# Patient Record
Sex: Female | Born: 1935 | Race: White | Hispanic: No | Marital: Single
Health system: Southern US, Community
[De-identification: ages and names within clinical notes are randomized; demographics above are authoritative.]

---

## 2019-09-01 ENCOUNTER — Observation Stay (HOSPITAL_COMMUNITY)
Admission: EM | Admit: 2019-09-01 | Discharge: 2019-09-02 | Disposition: A | Discharge status: Home / Self Care | Payer: Medicare Other | Attending: Surgery | Admitting: Surgery

## 2019-09-01 ENCOUNTER — Other Ambulatory Visit: Payer: Self-pay

## 2019-09-01 ENCOUNTER — Emergency Department: Payer: Self-pay

## 2019-09-01 ENCOUNTER — Emergency Department (HOSPITAL_COMMUNITY): Payer: Medicare Other

## 2019-09-01 DIAGNOSIS — R52 Pain, unspecified: Secondary | ICD-10-CM

## 2019-09-01 DIAGNOSIS — R2689 Other abnormalities of gait and mobility: Secondary | ICD-10-CM | POA: Diagnosis not present

## 2019-09-01 DIAGNOSIS — Z20822 Contact with and (suspected) exposure to covid-19: Secondary | ICD-10-CM | POA: Diagnosis not present

## 2019-09-01 DIAGNOSIS — W109XXA Fall (on) (from) unspecified stairs and steps, initial encounter: Secondary | ICD-10-CM | POA: Diagnosis not present

## 2019-09-01 DIAGNOSIS — R0902 Hypoxemia: Secondary | ICD-10-CM | POA: Insufficient documentation

## 2019-09-01 DIAGNOSIS — S2242XA Multiple fractures of ribs, left side, initial encounter for closed fracture: Secondary | ICD-10-CM | POA: Diagnosis present

## 2019-09-01 DIAGNOSIS — Z9181 History of falling: Secondary | ICD-10-CM | POA: Diagnosis not present

## 2019-09-01 DIAGNOSIS — S0101XA Laceration without foreign body of scalp, initial encounter: Secondary | ICD-10-CM | POA: Insufficient documentation

## 2019-09-01 DIAGNOSIS — I10 Essential (primary) hypertension: Secondary | ICD-10-CM | POA: Insufficient documentation

## 2019-09-01 DIAGNOSIS — E785 Hyperlipidemia, unspecified: Secondary | ICD-10-CM | POA: Diagnosis not present

## 2019-09-01 DIAGNOSIS — S2249XA Multiple fractures of ribs, unspecified side, initial encounter for closed fracture: Secondary | ICD-10-CM

## 2019-09-01 LAB — BASIC METABOLIC PANEL
Anion gap: 10 (ref 5–15)
BUN: 11 mg/dL (ref 8–23)
CO2: 24 mmol/L (ref 22–32)
Calcium: 9.2 mg/dL (ref 8.9–10.3)
Chloride: 107 mmol/L (ref 98–111)
Creatinine, Ser: 0.66 mg/dL (ref 0.44–1.00)
GFR calc Af Amer: >60 mL/min (ref >60)
GFR calc non Af Amer: >60 mL/min (ref >60)
Glucose, Bld: 168 mg/dL — ABNORMAL HIGH (ref 70–99)
Potassium: 3.9 mmol/L (ref 3.5–5.1)
Sodium: 141 mmol/L (ref 135–145)

## 2019-09-01 LAB — CBC
HCT: 34.5 % — ABNORMAL LOW (ref 36.0–46.0)
Hemoglobin: 11.2 g/dL — ABNORMAL LOW (ref 12.0–15.0)
MCH: 31.9 pg (ref 26.0–34.0)
MCHC: 32.5 g/dL (ref 30.0–36.0)
MCV: 98.3 fL (ref 80.0–100.0)
Platelets: 166 10*3/uL (ref 150–400)
RBC: 3.51 MIL/uL — ABNORMAL LOW (ref 3.87–5.11)
RDW: 13.3 % (ref 11.5–15.5)
WBC: 9.8 10*3/uL (ref 4.0–10.5)
nRBC: 0 % (ref 0.0–0.2)

## 2019-09-01 LAB — SARS CORONAVIRUS 2 BY RT PCR (HOSPITAL ORDER, PERFORMED IN ~~LOC~~ HOSPITAL LAB): SARS Coronavirus 2: NEGATIVE

## 2019-09-01 LAB — PROTIME-INR
INR: 1.1 (ref 0.8–1.2)
Prothrombin Time: 14.2 seconds (ref 11.4–15.2)

## 2019-09-01 MED ORDER — SODIUM CHLORIDE 0.9% FLUSH
3.0000 mL | Freq: Two times a day (BID) | INTRAVENOUS | Status: DC
  Administered 2019-09-01 – 2019-09-02 (×3): 3 mL via INTRAVENOUS

## 2019-09-01 MED ORDER — ACETAMINOPHEN 500 MG PO TABS
1000.0000 mg | ORAL_TABLET | Freq: Four times a day (QID) | ORAL | Status: DC
  Administered 2019-09-01 – 2019-09-02 (×5): 1000 mg via ORAL
  Filled 2019-09-01 (×6): qty 2

## 2019-09-01 MED ORDER — LIDOCAINE 5 % EX PTCH
1.0000 | MEDICATED_PATCH | CUTANEOUS | Status: DC
  Administered 2019-09-01 – 2019-09-02 (×2): 1 via TRANSDERMAL
  Filled 2019-09-01 (×2): qty 1

## 2019-09-01 MED ORDER — ENOXAPARIN SODIUM 30 MG/0.3ML ~~LOC~~ SOLN
30.0000 mg | Freq: Two times a day (BID) | SUBCUTANEOUS | Status: DC
  Administered 2019-09-01 – 2019-09-02 (×3): 30 mg via SUBCUTANEOUS
  Filled 2019-09-01 (×3): qty 0.3

## 2019-09-01 MED ORDER — WHITE PETROLATUM EX OINT
TOPICAL_OINTMENT | CUTANEOUS | Status: AC
  Filled 2019-09-01: qty 28.35

## 2019-09-01 MED ORDER — KETOROLAC TROMETHAMINE 15 MG/ML IJ SOLN
15.0000 mg | Freq: Four times a day (QID) | INTRAMUSCULAR | Status: DC | PRN
  Administered 2019-09-01 (×2): 15 mg via INTRAVENOUS
  Filled 2019-09-01 (×2): qty 1

## 2019-09-01 MED ORDER — ONDANSETRON 4 MG PO TBDP: 4.0000 mg | ORAL_TABLET | Freq: Four times a day (QID) | ORAL | Status: DC | PRN

## 2019-09-01 MED ORDER — SODIUM CHLORIDE 0.9 % IV SOLN: 250.0000 mL | INTRAVENOUS | Status: DC | PRN

## 2019-09-01 MED ORDER — ONDANSETRON HCL 4 MG/2ML IJ SOLN
4.0000 mg | Freq: Once | INTRAMUSCULAR | Status: AC
  Administered 2019-09-01: 4 mg via INTRAVENOUS
  Filled 2019-09-01: qty 2

## 2019-09-01 MED ORDER — ONDANSETRON HCL 4 MG/2ML IJ SOLN
4.0000 mg | Freq: Four times a day (QID) | INTRAMUSCULAR | Status: DC | PRN
  Administered 2019-09-01: 4 mg via INTRAVENOUS
  Filled 2019-09-01: qty 2

## 2019-09-01 MED ORDER — METHOCARBAMOL 500 MG PO TABS
1000.0000 mg | ORAL_TABLET | Freq: Three times a day (TID) | ORAL | Status: DC
  Administered 2019-09-01 – 2019-09-02 (×5): 1000 mg via ORAL
  Filled 2019-09-01 (×5): qty 2

## 2019-09-01 MED ORDER — DOCUSATE SODIUM 100 MG PO CAPS
100.0000 mg | ORAL_CAPSULE | Freq: Two times a day (BID) | ORAL | Status: DC
  Administered 2019-09-01 – 2019-09-02 (×3): 100 mg via ORAL
  Filled 2019-09-01 (×3): qty 1

## 2019-09-01 MED ORDER — SODIUM CHLORIDE 0.9% FLUSH
3.0000 mL | INTRAVENOUS | Status: DC | PRN
  Administered 2019-09-02: 3 mL via INTRAVENOUS

## 2019-09-01 NOTE — Progress Notes (Signed)
Occupational Therapy Evaluation Patient Details Name: Janet Dickerson MRN: [id number] DOB: 1927-12-27 Today's Date: 09/01/2019    History of Present Illness Pt is an 86 y/o female admitted after fall that resulted in L sided rib fxs. No pertinent PMH on file.    Clinical Impression   Prior to hospitalization, pt was Independent with all ADLs/IADLs including driving. Pt does not own AE/DME, except a raised commode. Pt admitted for above and limited by increased pain in L ribs, decreased activity tolerance, decreased ROM, and decreased balance. Today, pt received semi-reclined asleep. Pt c/o 4/10 pain in L ribs, using pillow for bracing during functional bed transitions. Pt tolerated sitting EOB for ~20 min to dress, sponge bathe, and groom. 3-4x pt c/o nausea, bucket provided for safety measures. Pt requires supervision for bed mobility, setup for grooming, min assist for UB dressing and bathing, supervision for doffing pants, max assist for managing socks, and min guard for functional transfers. Pt would benefit from continued skilled acute OT services to address ADLs/IADLs and functional mobility. Recommending HHOT and 24/7 supervision for safety secondary to increased pain. Will continue to follow pt acutely as able.     Follow Up Recommendations  Home health OT;Supervision/Assistance - 24 hour    Equipment Recommendations  3 in 1 bedside commode;Tub/shower bench    Recommendations for Other Services       Precautions / Restrictions Precautions Precautions: Fall Restrictions Weight Bearing Restrictions: No      Mobility Bed Mobility Overal bed mobility: Needs Assistance Bed Mobility: Sidelying to Sit;Sit to Supine   Sidelying to sit: Supervision;HOB elevated     Sit to sidelying: Supervision;HOB elevated General bed mobility comments: Supervision for safety. Increased time required.   Transfers Overall transfer level: Needs assistance Equipment used: None Transfers: Sit  to/from Stand Sit to Stand: Min guard         General transfer comment: Min guard for safety. Increased time required.     Balance Overall balance assessment: Needs assistance Sitting-balance support: No upper extremity supported;Feet supported Sitting balance-Leahy Scale: Good     Standing balance support: No upper extremity supported;During functional activity Standing balance-Leahy Scale: Fair Standing balance comment: able to maintain dynamic standind for LB dressing with min guard for balance                           ADL either performed or assessed with clinical judgement   ADL Overall ADL's : Needs assistance/impaired     Grooming: Wash/dry hands;Wash/dry face;Set up;Sitting   Upper Body Bathing: Minimal assistance;Sitting       Upper Body Dressing : Minimal assistance;Sitting   Lower Body Dressing: Min guard;Sitting/lateral leans;Sit to/from stand               Functional mobility during ADLs: Min guard General ADL Comments: pt tolerated sitting EOB for grooming, dressing, and sponge bathing; pt in too much pain to ambulate to/from bathroom     Vision Baseline Vision/History: Glaucoma;Cataracts;Wears glasses (pt reports having glaucoma and cataracts ) Wears Glasses: Reading only;Distance only Patient Visual Report: No change from baseline       Perception     Praxis      Pertinent Vitals/Pain Pain Assessment: Faces Pain Score: 3  Faces Pain Scale: Hurts whole lot Pain Location: L ribs Pain Descriptors / Indicators: Aching;Guarding;Grimacing Pain Intervention(s): Limited activity within patient's tolerance;Monitored during session;Repositioned     Hand Dominance     Extremity/Trunk Assessment Upper Extremity  Assessment Upper Extremity Assessment: Overall WFL for tasks assessed;Generalized weakness   Lower Extremity Assessment Lower Extremity Assessment: Defer to PT evaluation   Cervical / Trunk Assessment Cervical / Trunk  Assessment: Other exceptions Cervical / Trunk Exceptions: L side rib fxs   Communication Communication Communication: No difficulties   Cognition Arousal/Alertness: Awake/alert Behavior During Therapy: WFL for tasks assessed/performed Overall Cognitive Status: Within Functional Limits for tasks assessed                                     General Comments  used pillow for bracing L ribs; provided bag secondary to nausea    Exercises     Shoulder Instructions      Home Living Family/patient expects to be discharged to:: Private residence Living Arrangements: Alone Available Help at Discharge: Family Type of Home: House Home Access: Stairs to enter Technical brewer of Steps: 2   Home Layout: One level     Bathroom Shower/Tub: Teacher, early years/pre: Handicapped height     Home Equipment: None          Prior Functioning/Environment Level of Independence: Independent                 OT Problem List: Decreased activity tolerance;Impaired balance (sitting and/or standing);Pain      OT Treatment/Interventions: Self-care/ADL training;Therapeutic exercise;Energy conservation;DME and/or AE instruction;Therapeutic activities;Patient/family education    OT Goals(Current goals can be found in the care plan section) Acute Rehab OT Goals Patient Stated Goal: to go home OT Goal Formulation: With patient  OT Frequency: Min 3X/week   Barriers to D/C:            Co-evaluation              AM-PAC OT "6 Clicks" Daily Activity     Outcome Measure Help from another person eating meals?: None (however pt refusing to eat today secondary to nausea) Help from another person taking care of personal grooming?: A Little Help from another person toileting, which includes using toliet, bedpan, or urinal?: A Little Help from another person bathing (including washing, rinsing, drying)?: A Little Help from another person to put on and taking  off regular upper body clothing?: A Little Help from another person to put on and taking off regular lower body clothing?: A Little 6 Click Score: 19   End of Session Equipment Utilized During Treatment: Gait belt Nurse Communication: Mobility status  Activity Tolerance: Patient tolerated treatment well;Patient limited by pain Patient left: in bed;with call bell/phone within reach;with chair alarm set;with nursing/sitter in room  OT Visit Diagnosis: Unsteadiness on feet (R26.81);Muscle weakness (generalized) (M62.81);Pain Pain - Right/Left: Left Pain - part of body:  (ribs)                Time: 0865-7846 OT Time Calculation (min): 36 min Charges:  OT General Charges $OT Visit: 1 Visit OT Evaluation $OT Eval Low Complexity: 1 Low OT Treatments $Self Care/Home Management : 8-22 mins  Michel Bickers, OTR/L [facility] [phone]  Francesca Jewett 09/01/2019, 7:47 PM

## 2019-09-01 NOTE — Evaluation (Signed)
Physical Therapy Evaluation Patient Details Name: Janet Dickerson MRN: [id number] DOB: Mar 21, 1927 Today's Date: 09/01/2019   History of Present Illness  Pt is an 86 y/o female admitted after fall that resulted in L sided rib fxs. No pertinent PMH on file.   Clinical Impression  Pt admitted secondary to problem above with deficits below. Required min guard for mobility tasks. Improved safety noted with use of RW. Educated about using RW at home. Pt reports daughter and son in law can assist as needed. Feel pt would benefit from HHPT at d/c. Will continue to follow acutely.     Follow Up Recommendations Home health PT;Supervision for mobility/OOB    Equipment Recommendations  Rolling walker with 5" wheels    Recommendations for Other Services       Precautions / Restrictions Precautions Precautions: Fall Restrictions Weight Bearing Restrictions: No      Mobility  Bed Mobility Overal bed mobility: Needs Assistance Bed Mobility: Sidelying to Sit;Sit to Sidelying   Sidelying to sit: Supervision     Sit to sidelying: Supervision General bed mobility comments: Supervision for safety. Increased time required.   Transfers Overall transfer level: Needs assistance Equipment used: None Transfers: Sit to/from Stand Sit to Stand: Min guard         General transfer comment: Min guard for safety. Increased time required.   Ambulation/Gait Ambulation/Gait assistance: Min guard Gait Distance (Feet): 40 Feet Assistive device: 1 person hand held assist;Rolling walker (2 wheeled) Gait Pattern/deviations: Step-to pattern;Decreased stride length Gait velocity: Decreased   General Gait Details: Pt reaching out to hold for objects without AD. Much improved safety with use of RW. Educated about using a RW at home to increase independence and safety.   Stairs            Wheelchair Mobility    Modified Rankin (Stroke Patients Only)       Balance Overall balance assessment:  Needs assistance Sitting-balance support: No upper extremity supported;Feet supported Sitting balance-Leahy Scale: Good     Standing balance support: Single extremity supported;Bilateral upper extremity supported;During functional activity;No upper extremity supported Standing balance-Leahy Scale: Fair Standing balance comment: Able to maintain static standing without UE support                              Pertinent Vitals/Pain Pain Assessment: Faces Faces Pain Scale: Hurts a little bit Pain Location: L ribs Pain Descriptors / Indicators: Aching;Guarding;Grimacing Pain Intervention(s): Monitored during session;Limited activity within patient's tolerance;Repositioned    Home Living Family/patient expects to be discharged to:: Private residence Living Arrangements: Alone Available Help at Discharge: Family Type of Home: House Home Access: Stairs to enter Entrance Stairs-Rails:  (has a post to hold) Technical brewer of Steps: 2 Home Layout: One level Home Equipment: None      Prior Function Level of Independence: Independent               Hand Dominance        Extremity/Trunk Assessment   Upper Extremity Assessment Upper Extremity Assessment: Defer to OT evaluation    Lower Extremity Assessment Lower Extremity Assessment: Generalized weakness    Cervical / Trunk Assessment Cervical / Trunk Assessment: Other exceptions Cervical / Trunk Exceptions: L side rib fxs  Communication   Communication: No difficulties  Cognition Arousal/Alertness: Awake/alert Behavior During Therapy: WFL for tasks assessed/performed Overall Cognitive Status: Within Functional Limits for tasks assessed  General Comments General comments (skin integrity, edema, etc.): Pt's daughter present. Discussed using a pillow for bracing L ribs.     Exercises     Assessment/Plan    PT Assessment Patient needs  continued PT services  PT Problem List Decreased strength;Decreased balance;Decreased mobility;Decreased knowledge of use of DME;Pain       PT Treatment Interventions Gait training;DME instruction;Functional mobility training;Stair training;Therapeutic activities;Balance training;Therapeutic exercise;Patient/family education    PT Goals (Current goals can be found in the Care Plan section)  Acute Rehab PT Goals Patient Stated Goal: to go home PT Goal Formulation: With patient Time For Goal Achievement: 09/15/19 Potential to Achieve Goals: Good    Frequency Min 3X/week   Barriers to discharge        Co-evaluation               AM-PAC PT "6 Clicks" Mobility  Outcome Measure Help needed turning from your back to your side while in a flat bed without using bedrails?: None Help needed moving from lying on your back to sitting on the side of a flat bed without using bedrails?: None Help needed moving to and from a bed to a chair (including a wheelchair)?: A Little Help needed standing up from a chair using your arms (e.g., wheelchair or bedside chair)?: A Little Help needed to walk in hospital room?: A Little Help needed climbing 3-5 steps with a railing? : A Little 6 Click Score: 20    End of Session   Activity Tolerance: Patient tolerated treatment well Patient left: in bed;with call bell/phone within reach;with bed alarm set Nurse Communication: Mobility status PT Visit Diagnosis: Other abnormalities of gait and mobility (R26.89);History of falling (Z91.81)    Time: 9675-9163 PT Time Calculation (min) (ACUTE ONLY): 22 min   Charges:   PT Evaluation $PT Eval Low Complexity: 1 Low          Cindee Salt, DPT  Acute Rehabilitation Services  Pager: [phone] Office: [phone]   Lehman Prom 09/01/2019, 3:54 PM

## 2019-09-01 NOTE — Progress Notes (Signed)
Pm check.  Patient doing well.  Pain is controlled currently.  She states she has worked with therapies this afternoon, although I do not see a note yet.  She has sufficient help at home between her family and her friends.  Will await therapy recommendations and hopefully she can DC home tomorrow if she is able.  Letha Cape 3:15 PM 09/01/2019

## 2019-09-01 NOTE — ED Provider Notes (Signed)
[facility] EMERGENCY DEPARTMENT Provider Note   CSN: [id number] Arrival date & time: 09/01/19  0408     History Chief Complaint  Patient presents with  . Fall    Janet Dickerson is a 86 y.o. female.  Patient transferred from [facility] after a fall.  Work-up revealed that she has multiple left-sided rib fractures and she was transferred for trauma evaluation and hospitalization secondary to her significant risk for developing pneumonia.  Patient reports only mild pain at arrival after having received morphine.  She has no other complaints.  She is breathing comfortably.        No past medical history on file.  There are no problems to display for this patient.      OB History   No obstetric history on file.     No family history on file.  Social History   Tobacco Use  . Smoking status: Not on file  Substance Use Topics  . Alcohol use: Not on file  . Drug use: Not on file    Home Medications Prior to Admission medications   Not on File    Allergies    Patient has no known allergies.  Review of Systems   Review of Systems  Musculoskeletal:       Chest wall pain  Skin: Positive for wound.  All other systems reviewed and are negative.   Physical Exam Updated Vital Signs BP (!) 148/67 (BP Location: Right Arm)   Pulse 78   Temp 97.6 F (36.4 C) (Oral)   Resp (!) 22   Ht 5\' 3"  (1.6 m)   Wt 68 kg   SpO2 98%   BMI 26.57 kg/m   Physical Exam Vitals and nursing note reviewed.  Constitutional:      General: She is not in acute distress.    Appearance: Normal appearance. She is well-developed.  HENT:     Head: Normocephalic.     Comments: Left posterior scalp laceration with staples in place, no bleeding    Right Ear: Hearing normal.     Left Ear: Hearing normal.     Nose: Nose normal.  Eyes:     Conjunctiva/sclera: Conjunctivae normal.     Pupils: Pupils are equal, round, and reactive to light.  Cardiovascular:      Rate and Rhythm: Regular rhythm.     Heart sounds: S1 normal and S2 normal. No murmur heard.  No friction rub. No gallop.   Pulmonary:     Effort: Pulmonary effort is normal. No respiratory distress.     Breath sounds: Normal breath sounds.  Chest:     Chest wall: Tenderness (Diffuse left-sided tenderness) present. No deformity or crepitus.  Abdominal:     General: Bowel sounds are normal.     Palpations: Abdomen is soft.     Tenderness: There is no abdominal tenderness. There is no guarding or rebound. Negative signs include Murphy's sign and McBurney's sign.     Hernia: No hernia is present.  Musculoskeletal:        General: Normal range of motion.     Cervical back: Normal range of motion and neck supple.  Skin:    General: Skin is warm and dry.     Findings: Laceration present. No rash.  Neurological:     Mental Status: She is alert and oriented to person, place, and time.     GCS: GCS eye subscore is 4. GCS verbal subscore is 5. GCS motor subscore is 6.  Cranial Nerves: No cranial nerve deficit.     Sensory: No sensory deficit.     Coordination: Coordination normal.  Psychiatric:        Speech: Speech normal.        Behavior: Behavior normal.        Thought Content: Thought content normal.     ED Results / Procedures / Treatments   Labs (all labs ordered are listed, but only abnormal results are displayed) Labs Reviewed  CBC - Abnormal; Notable for the following components:      Result Value   RBC 3.51 (*)    Hemoglobin 11.2 (*)    HCT 34.5 (*)    All other components within normal limits  BASIC METABOLIC PANEL - Abnormal; Notable for the following components:   Glucose, Bld 168 (*)    All other components within normal limits  SARS CORONAVIRUS 2 BY RT PCR (HOSPITAL ORDER, PERFORMED IN [facility])  PROTIME-INR    EKG None  Radiology DG Chest Port 1 View  Result Date: 09/01/2019 CLINICAL DATA:  Fall with rib fractures. EXAM: PORTABLE CHEST 1  VIEW COMPARISON:  None. FINDINGS: History of acute left rib fractures which are difficult to visualize radiographically. Low lung volumes with mild atelectasis at the bases. No visible effusion or pneumothorax. IMPRESSION: History of acute left rib fractures which are not well visualized. Low volume chest with mild atelectasis.  No visible pneumothorax. Electronically Signed   By: Marnee Spring M.D.   On: 09/01/2019 04:47    Procedures Procedures (including critical care time)  Medications Ordered in ED Medications  ondansetron (ZOFRAN) injection 4 mg (4 mg Intravenous Given 09/01/19 0431)    ED Course  I have reviewed the triage vital signs and the nursing notes.  Pertinent labs & imaging results that were available during my care of the patient were reviewed by me and considered in my medical decision making (see chart for details).    MDM Rules/Calculators/A&P                          Patient transferred to the emergency department to be admitted to the hospital for monitoring her pulmonary toilet secondary to multiple rib fractures and her advanced age.  She is stable at arrival.  Pending trauma service evaluation.  Final Clinical Impression(s) / ED Diagnoses Final diagnoses:  Closed fracture of multiple ribs of left side, initial encounter    Rx / DC Orders ED Discharge Orders    None       Jovani Colquhoun, Canary Brim, MD 09/01/19 [phone]

## 2019-09-01 NOTE — H&P (Signed)
TRAUMA H&P  09/01/2019, 6:18 AM   Chief Complaint: transfer, rib fractures Consultant: Blinda Leatherwood, MD  Primary Survey:  The patient is an 86 y.o. female.   HPI: 73F s/p mechanical ground level fall after thinking there was an extra step when there wasn't. She does report hitting her head when she fell, but denies loss of consciousness. She does report some bleeding from the left arm. She is very functional and independent at baseline, living at home alone and still driving herself around town. Her daughter lives away in [facility] and her son-in-law lives away. After her fall, she called her daughter, who called her son-in-law, who then called EMS.  No past medical history on file.  No pertinent family history.  Social History:  has no history on file for tobacco use, alcohol use, and drug use.   Allergies: No Known Allergies  Medications: reviewed  Results for orders placed or performed during the hospital encounter of 09/01/19 (from the past 48 hour(s))  SARS Coronavirus 2 by RT PCR (hospital order, performed in [facility] hospital lab) Nasopharyngeal Nasopharyngeal Swab     Status: None   Collection Time: 09/01/19  4:47 AM   Specimen: Nasopharyngeal Swab  Result Value Ref Range   SARS Coronavirus 2 NEGATIVE NEGATIVE    Comment: (NOTE) SARS-CoV-2 target nucleic acids are NOT DETECTED.  The SARS-CoV-2 RNA is generally detectable in upper and lower respiratory specimens during the acute phase of infection. The lowest concentration of SARS-CoV-2 viral copies this assay can detect is 250 copies / mL. A negative result does not preclude SARS-CoV-2 infection and should not be used as the sole basis for treatment or other patient management decisions.  A negative result may occur with improper specimen collection / handling, submission of specimen other than nasopharyngeal swab, presence of viral mutation(s) within the areas targeted by this assay, and inadequate number  of viral copies (<250 copies / mL). A negative result must be combined with clinical observations, patient history, and epidemiological information.  Fact Sheet for Patients:   BoilerBrush.com.cy  Fact Sheet for Healthcare Providers: https://pope.com/  This test is not yet approved or  cleared by the [place] FDA and has been authorized for detection and/or diagnosis of SARS-CoV-2 by FDA under an Emergency Use Authorization (EUA).  This EUA will remain in effect (meaning this test can be used) for the duration of the COVID-19 declaration under Section 564(b)(1) of the Act, 21 U.S.C. section 360bbb-3(b)(1), unless the authorization is terminated or revoked sooner.  Performed at [facility], [address] [place], [place] [address]   CBC     Status: Abnormal   Collection Time: 09/01/19  5:02 AM  Result Value Ref Range   WBC 9.8 4.0 - 10.5 K/uL   RBC 3.51 (L) 3.87 - 5.11 MIL/uL   Hemoglobin 11.2 (L) 12.0 - 15.0 g/dL   HCT 83.3 (L) 36 - 46 %   MCV 98.3 80.0 - 100.0 fL   MCH 31.9 26.0 - 34.0 pg   MCHC 32.5 30.0 - 36.0 g/dL   RDW 82.5 05.3 - 97.6 %   Platelets 166 150 - 400 K/uL   nRBC 0.0 0.0 - 0.2 %    Comment: Performed at [facility], [address], [place], [place] [address]  Basic metabolic panel     Status: Abnormal   Collection Time: 09/01/19  5:02 AM  Result Value Ref Range   Sodium 141 135 - 145  mmol/L   Potassium 3.9 3.5 - 5.1 mmol/L   Chloride 107 98 - 111 mmol/L   CO2 24 22 - 32 mmol/L   Glucose, Bld 168 (H) 70 - 99 mg/dL    Comment: Glucose reference range applies only to samples taken after fasting for at least 8 hours.   BUN 11 8 - 23 mg/dL   Creatinine, Ser 0.66 0.44 - 1.00 mg/dL   Calcium 9.2 8.9 - 10.3 mg/dL   GFR calc non Af Amer >60 >60 mL/min   GFR calc Af Amer >60 >60 mL/min   Anion gap 10 5 - 15    Comment: Performed at [facility] [address] [place], [place] [address]   Protime-INR     Status: None   Collection Time: 09/01/19  5:02 AM  Result Value Ref Range   Prothrombin Time 14.2 11.4 - 15.2 seconds   INR 1.1 0.8 - 1.2    Comment: (NOTE) INR goal varies based on device and disease states. Performed at [facility], [place] [address] [place], [place] [address]     DG Chest Port 1 View  Result Date: 09/01/2019 CLINICAL DATA:  Fall with rib fractures. EXAM: PORTABLE CHEST 1 VIEW COMPARISON:  None. FINDINGS: History of acute left rib fractures which are difficult to visualize radiographically. Low lung volumes with mild atelectasis at the bases. No visible effusion or pneumothorax. IMPRESSION: History of acute left rib fractures which are not well visualized. Low volume chest with mild atelectasis.  No visible pneumothorax. Electronically Signed   By: Monte Fantasia M.D.   On: 09/01/2019 04:47    ROS 10 point review of systems is negative except as listed above in HPI.  Blood pressure (!) 148/67, pulse 78, temperature 97.6 F (36.4 C), temperature source Oral, resp. rate (!) 22, height 5\' 3"  (1.6 m), weight 68 kg, SpO2 98 %.  Secondary Survey:  GCS: E(4)//V(5)//M(6) Constitutional: well-developed, well-nourished Skull: normocephalic, atraumatic Eyes: pupils equal, round, reactive to light, 16mm b/l, moist conjunctiva Face/ENT: midface stable without deformity, normal dentition, external inspection of ears and nose normal, hearing intact Oropharynx: normal oropharyngeal mucosa, no blood Neck: no thyromegaly, trachea midline, c-collar absent, no midline cervical tenderness to palpation, no C-spine stepoffs Chest: breath sounds equal bilaterally, normal respiratory effort, no midline tenderness to palpation, + left lateral chest wall tenderness to palpation without deformity, L reconstructive breast implant Abdomen: soft, NT, no bruising, no hepatosplenomegaly FAST: not performed Pelvis: stable GU: normal female genitalia Back: no wounds, no T/L  spine TTP, no T/L spine stepoffs Rectal: deferred Extremities: motor and sensation intact to bilateral UE and LE, no peripheral edema, abrasion to the left elbow, hemostatic MSK: unable to assess gait/station, no clubbing/cyanosis of fingers/toes, normal ROM of all four extremities Skin: warm, dry, no rashes   Assessment/Plan: Problem List 44F s/p mechanical GLF  Plan L rib fractures with mild hypoxia - pain control, IS/pulm toilet, therapies FEN - regular diet DVT - SCDs, LMWH Dispo - Admit to floor, observation status  CT images from [facility] sent on a disc and personally reviewed by me: CT head, c-spine, C/A/P. Discussion held with the patient regarding code status and she expresses a desire "not to live on life support", however when specifically asked if she would like chest compressions, defibrillation, intubation, she says she is willing to accept all "until someone can get here." Based on our conversation, I am documenting her wishes as full code.   Jesusita Oka,  MD General and Trauma Surgery [facility]

## 2019-09-01 NOTE — TOC CAGE-AID Note (Signed)
Transition of Care [facility]) - CAGE-AID Screening   Patient Details  Name: Janet Dickerson MRN: [id number] Date of Birth: 07/30/1927  Transition of Care [facility]) CM/SW Contact:    Jimmy Picket, LCSWA Phone Number: 09/01/2019, 3:15 PM   Clinical Narrative:  Pt denied alcohol use and substance use.   CAGE-AID Screening:    Have You Ever Felt You Ought to Cut Down on Your Drinking or Drug Use?: No Have People Annoyed You By Critizing Your Drinking Or Drug Use?: No Have You Felt Bad Or Guilty About Your Drinking Or Drug Use?: No Have You Ever Had a Drink or Used Drugs First Thing In The Morning to STeady Your Nerves or to Get Rid of a Hangover?: No CAGE-AID Score: 0  Substance Abuse Education Offered: No    Denita Lung, Bridget Hartshorn Clinical Social Worker [phone]

## 2019-09-01 NOTE — ED Triage Notes (Signed)
Transfer from [facility]. Pt had a fall off her deck down two stairs. Pt has 4 L rib fx and a posterior scalp laceration closed with 2 staples. Enroute pt given 2mg  morphine. Pt also on 2L [place] at this time. Pain 4/10. Provider at bedside

## 2019-09-01 NOTE — TOC Initial Note (Signed)
Transition of Care [facility]) - Initial/Assessment Note    Patient Details  Name: Janet Dickerson MRN: [id number] Date of Birth: 10/10/1927  Transition [facility] Care [facility]) CM/SW Contact:    Glennon Mac, RN Phone Number: 09/01/2019, 4:40 PM  Clinical Narrative: Pt is an 86 y/o female admitted after fall that resulted in L sided rib fxs. PTA, pt independent, lives at home alone.  PT recommending HH follow up; awaiting OT recs.  Pt states daughter and son in law can assist with care as needed at discharge.  Will follow for HH/DME orders once OT eval complete.                    Expected Discharge Plan: Home w Home Health Services Barriers to Discharge: Continued Medical Work up   Patient Goals and CMS Choice   CMS Medicare.gov Compare Post Acute Care list provided to:: Patient Choice offered to / list presented to : Patient  Expected Discharge Plan and Services Expected Discharge Plan: Home w Home Health Services   Discharge Planning Services: CM Consult Post Acute Care Choice: Home Health Living arrangements for the past 2 months: Single Family Home                                      Prior Living Arrangements/Services Living arrangements for the past 2 months: Single Family Home Lives with:: Self Patient language and need for interpreter reviewed:: Yes Do you feel safe going back to the place where you live?: Yes      Need for Family Participation in Patient Care: Yes (Comment) Care giver support system in place?: Yes (comment)   Criminal Activity/Legal Involvement Pertinent to Current Situation/Hospitalization: No - Comment as needed  Activities of Daily Living      Permission Sought/Granted                  Emotional Assessment Appearance:: Appears stated age Attitude/Demeanor/Rapport: Engaged Affect (typically observed): Accepting Orientation: : Oriented to Self, Oriented to Place, Oriented to  Time, Oriented to Situation      Admission diagnosis:  Rib  fractures [S22.39XA] Pain [R52] Closed fracture of multiple ribs of left side, initial encounter [S22.42XA] Patient Active Problem List   Diagnosis Date Noted  . Rib fractures 09/01/2019   PCP:  Woodroe Chen, MD Pharmacy:  No Pharmacies Listed    Social Determinants of Health (SDOH) Interventions    Readmission Risk Interventions No flowsheet data found.   Quintella Baton, RN, BSN  Trauma/Neuro ICU Case Manager [phone]

## 2019-09-01 NOTE — ED Notes (Signed)
Report attempted 

## 2019-09-02 ENCOUNTER — Observation Stay (HOSPITAL_COMMUNITY): Payer: Medicare Other

## 2019-09-02 DIAGNOSIS — S2242XA Multiple fractures of ribs, left side, initial encounter for closed fracture: Secondary | ICD-10-CM | POA: Diagnosis not present

## 2019-09-02 LAB — BASIC METABOLIC PANEL
Anion gap: 10 (ref 5–15)
BUN: 17 mg/dL (ref 8–23)
CO2: 27 mmol/L (ref 22–32)
Calcium: 9.1 mg/dL (ref 8.9–10.3)
Chloride: 105 mmol/L (ref 98–111)
Creatinine, Ser: 0.88 mg/dL (ref 0.44–1.00)
GFR calc Af Amer: >60 mL/min (ref >60)
GFR calc non Af Amer: 58 mL/min — ABNORMAL LOW (ref >60)
Glucose, Bld: 107 mg/dL — ABNORMAL HIGH (ref 70–99)
Potassium: 3.7 mmol/L (ref 3.5–5.1)
Sodium: 142 mmol/L (ref 135–145)

## 2019-09-02 LAB — CBC
HCT: 31 % — ABNORMAL LOW (ref 36.0–46.0)
Hemoglobin: 10 g/dL — ABNORMAL LOW (ref 12.0–15.0)
MCH: 31.9 pg (ref 26.0–34.0)
MCHC: 32.3 g/dL (ref 30.0–36.0)
MCV: 99 fL (ref 80.0–100.0)
Platelets: 152 10*3/uL (ref 150–400)
RBC: 3.13 MIL/uL — ABNORMAL LOW (ref 3.87–5.11)
RDW: 13.4 % (ref 11.5–15.5)
WBC: 5.6 10*3/uL (ref 4.0–10.5)
nRBC: 0 % (ref 0.0–0.2)

## 2019-09-02 MED ORDER — IBUPROFEN 400 MG PO TABS: 400.0000 mg | ORAL_TABLET | Freq: Four times a day (QID) | ORAL | Status: DC | PRN

## 2019-09-02 MED ORDER — ACETAMINOPHEN 500 MG PO TABS: 1000.0000 mg | ORAL_TABLET | Freq: Four times a day (QID) | ORAL | 0 refills | Status: AC | PRN

## 2019-09-02 MED ORDER — IBUPROFEN 400 MG PO TABS: 400.0000 mg | ORAL_TABLET | Freq: Four times a day (QID) | ORAL | 0 refills | Status: AC | PRN

## 2019-09-02 MED ORDER — LIDOCAINE 5 % EX PTCH: 1.0000 | MEDICATED_PATCH | CUTANEOUS | 1 refills | Status: AC

## 2019-09-02 NOTE — Progress Notes (Signed)
Janet Dickerson to be D/C'd  per MD order. Discussed with the patient and all questions fully answered.  VSS, Skin clean, dry and intact without evidence of skin break down, no evidence of skin tears noted.  IV catheter discontinued intact. Site without signs and symptoms of complications. Dressing and pressure applied.  An After Visit Summary was printed and given to the patient. Patient received prescription.  D/c education completed with patient/family including follow up instructions, medication list, d/c activities limitations if indicated, with other d/c instructions as indicated by MD - patient able to verbalize understanding, all questions fully answered.   Patient instructed to return to ED, call 911, or call MD for any changes in condition.   Patient to be escorted via WC, and D/C home via private auto.

## 2019-09-02 NOTE — Discharge Instructions (Signed)
Rib Fracture  A rib fracture is a break or crack in one of the bones of the ribs. The ribs are like a cage that goes around your upper chest. A broken or cracked rib is often painful, but most do not cause other problems. Most rib fractures usually heal on their own in 1-3 months. Follow these instructions at home: Managing pain, stiffness, and swelling  If directed, apply ice to the injured area. ? Put ice in a plastic bag. ? Place a towel between your skin and the bag. ? Leave the ice on for 20 minutes, 2-3 times a day.  Take over-the-counter and prescription medicines only as told by your doctor. Activity  Avoid activities that cause pain to the injured area. Protect your injured area.  Slowly increase activity as told by your doctor. General instructions  Do deep breathing as told by your doctor. You may be told to: ? Take deep breaths many times a day. ? Cough many times a day while hugging a pillow. ? Use a device (incentive spirometer) to do deep breathing many times a day.  Drink enough fluid to keep your pee (urine) clear or pale yellow.  Do not wear a rib belt or binder. These do not allow you to breathe deeply.  Keep all follow-up visits as told by your doctor. This is important. Contact a doctor if:  You have a fever. Get help right away if:  You have trouble breathing.  You are short of breath.  You cannot stop coughing.  You cough up thick or bloody spit (sputum).  You feel sick to your stomach (nauseous), throw up (vomit), or have belly (abdominal) pain.  Your pain gets worse and medicine does not help. Summary  A rib fracture is a break or crack in one of the bones of the ribs.  Apply ice to the injured area and take medicines for pain as told by your doctor.  Take deep breaths and cough many times a day. Hug a pillow every time you cough. This information is not intended to replace advice given to you by your health care provider. Make sure you  discuss any questions you have with your health care provider. Document Revised: 02/16/2017 Document Reviewed: 06/06/2016 Elsevier Patient Education  2020 Elsevier Inc.  

## 2019-09-02 NOTE — Care Management Obs Status (Signed)
MEDICARE OBSERVATION STATUS NOTIFICATION   Patient Details  Name: Janet Dickerson MRN: [id number] Date of Birth: 01/11/1928   Medicare Observation Status Notification Given:  Yes    Glennon Mac, RN 09/02/2019, 10:31 AM

## 2019-09-02 NOTE — Discharge Summary (Signed)
[facility] Discharge Summary   Patient ID: Janet Dickerson MRN: [id number] DOB/AGE: [birth year] 86 y.o.  Admit date: 09/01/2019 Discharge date: 09/02/2019  Discharge Diagnosis Patient Active Problem List   Diagnosis Date Noted  . Rib fractures 09/01/2019   Consultants N/A  Imaging: DG CHEST PORT 1 VIEW  Result Date: 09/02/2019 CLINICAL DATA:  Rib fractures EXAM: PORTABLE CHEST 1 VIEW COMPARISON:  Yesterday FINDINGS: Improved aeration, although there is still atelectasis at the right base. Normal heart size and mediastinal contours. No visible effusion or pneumothorax. Known rib fractures. IMPRESSION: Atelectasis at the right base. Aeration is mildly improved from yesterday. Electronically Signed   By: Monte Fantasia M.D.   On: 09/02/2019 06:33   DG Chest Port 1 View  Result Date: 09/01/2019 CLINICAL DATA:  Fall with rib fractures. EXAM: PORTABLE CHEST 1 VIEW COMPARISON:  None. FINDINGS: History of acute left rib fractures which are difficult to visualize radiographically. Low lung volumes with mild atelectasis at the bases. No visible effusion or pneumothorax. IMPRESSION: History of acute left rib fractures which are not well visualized. Low volume chest with mild atelectasis.  No visible pneumothorax. Electronically Signed   By: Monte Fantasia M.D.   On: 09/01/2019 04:47   CT OUTSIDE FILMS BODY/ABD/PELVIS  Result Date: 09/01/2019 This examination belongs to an outside facility and is stored here for comparison purposes only.  Contact the originating outside institution for any associated report or interpretation.  Procedures None   HPI: [name] She does report hitting her head when she fell, but denies loss of consciousness. She does report some bleeding from the left arm. She is very functional and independent at baseline, living at home alone and still driving herself around town.  Her daughter lives 31min away in [facility] and her son-in-law lives 53min away. After her fall, she called her daughter, who called her son-in-law, who then called EMS.  Hospital Course:  Workup significant for left sided rib fractures with hypoxia and a small scalp laceration repaired with staples in the ED. The patient was admitted for observation, pain control, and physical therapy. On hospital day #1 patients vitals were stable, oxygenating on room air, chest x-ray stable without pneumothorax, tolerating PO, mobilizing with walker, and stable for discharge home.  Patient will follow up in our office as needed, her pcp will remove staples, and knows to call with questions or concerns.    Allergies as of 09/02/2019   No Known Allergies     Medication List    TAKE these medications   acetaminophen 500 MG tablet Commonly known as: TYLENOL Take 2 tablets (1,000 mg total) by mouth every 6 (six) hours as needed for mild pain or moderate pain.   amLODipine 5 MG tablet Commonly known as: NORVASC Take 5 mg by mouth daily.   atorvastatin 10 MG tablet Commonly known as: LIPITOR Take 10 mg by mouth daily.   CALCIUM 1200 PO Take 1,200 mg by mouth daily.   carvedilol 12.5 MG tablet Commonly known as: COREG Take 12.5 mg by mouth 2 (two) times daily.   cholecalciferol 25 MCG (1000 UNIT) tablet Commonly known as: VITAMIN D3 Take 2,000 Units by mouth daily.   cyanocobalamin 1000 MCG tablet Take 1,000 mcg by mouth daily as needed (vit b).   denosumab 60 MG/ML Sosy injection Commonly known as: PROLIA Inject 60 mg into the skin every 6 (six) months.   erythromycin  ophthalmic ointment Place 1 application into the left eye at bedtime. For 1 week, then wait 30 days and repeat if needed   fenofibrate 160 MG tablet Take 160 mg by mouth daily.   ibuprofen 400 MG tablet Commonly known as: ADVIL Take 1 tablet (400 mg total) by mouth every 6 (six) hours as needed for headache, mild pain or  moderate pain.   lidocaine 5 % Commonly known as: LIDODERM Place 1 patch onto the skin daily for 7 days. Remove & Discard patch within 12 hours or as directed by MD Start taking on: September 03, 2019   loperamide 2 MG tablet Commonly known as: IMODIUM A-D Take 2 mg by mouth 4 (four) times daily as needed for diarrhea or loose stools.   losartan 100 MG tablet Commonly known as: COZAAR Take 100 mg by mouth daily.   timolol 0.5 % ophthalmic solution Commonly known as: TIMOPTIC Place 1 drop into both eyes 2 (two) times daily.   VITAMIN C PO Take 180 mg by mouth daily.            Durable Medical Equipment  (From admission, onward)         Start     Ordered   09/02/19 0801  For home use only DME 3 n 1  Once        09/02/19 0800   09/02/19 0801  For home use only DME Tub bench  Once        09/02/19 0800   09/02/19 0801  For home use only DME Walker rolling  Once       Comments: To help patient transfer and ambulate.  Physical / Occupational Therapy may change type of walker PRN.  Question Answer Comment  Walker: With 5 Inch Wheels   Patient needs a walker to treat with the following condition Multiple rib fractures involving four or more ribs   Patient needs a walker to treat with the following condition Fall      09/02/19 0800            Follow-up Information    Woodroe Chen, MD. Go on 09/09/2019.   Specialty: Internal Medicine Why: @ 2:40p with Alfredo Martinez, NP for hospital followup and staple removal from your scalp Contact information: [address] [place] [address] [phone]        [facility] Follow up.   Why: call as needed  Contact information: [address] [place] [address] [phone]             Signed: Hosie Spangle, [facility] 09/02/2019, 1:50 PM

## 2019-09-02 NOTE — TOC Transition Note (Signed)
Transition of Care [facility]) - CM/SW Discharge Note   Patient Details  Name: Janet Dickerson MRN: [id number] Date of Birth: January 15, 1928  Transition of Care [facility]) CM/SW Contact:  Glennon Mac, RN Phone Number: 09/02/2019, 12:02 PM   Clinical Narrative: Pt for likely discharge home later this afternoon.  PT/OT recommending HH and DME, and pt agreeable to home services. Pt states son in law is getting all of her equipment needed for home; I spoke with her daughter, Steward Drone and she confirms this.  Referral to [facility], per pt/family choice.  Pt states daughter and son in law to provide 24h assistance at discharge.        Final next level of care: Home w Home Health Services Barriers to Discharge: Continued Medical Work up   Patient Goals and CMS Choice Patient states their goals for this hospitalization and ongoing recovery are:: to get back home CMS Medicare.gov Compare Post Acute Care list provided to:: Patient Choice offered to / list presented to : Patient                         Discharge Plan and Services   Discharge Planning Services: CM Consult Post Acute Care Choice: Home Health                    HH Arranged: PT, OT [facility] Agency: [facility] Date [facility] Agency Contacted: 09/02/19 Time HH Agency Contacted: 1202 Representative spoke with at [facility] Agency: Lorenza Chick  Social Determinants of Health (SDOH) Interventions     Readmission Risk Interventions No flowsheet data found.  Quintella Baton, RN, BSN  Trauma/Neuro ICU Case Manager [phone]

## 2019-09-02 NOTE — Progress Notes (Addendum)
[facility] Progress Note     Subjective: CC:  Some pain over left ribcage. Pulled 750 on IS. Has not eaten today because yesterday it caused dry heaves. Denies emesis. Voiding ok - states she has been up to the bathroom this AM.   She reports having at least 2 family members and one friend available to help her at home. States her daughter may be able to stay with her for a couple of days.  Objective: Vital signs in last 24 hours: Temp:  [97.9 F (36.6 C)-99.2 F (37.3 C)] 97.9 F (36.6 C) (06/15 0446) Pulse Rate:  [67-77] 67 (06/15 0446) Resp:  [15-20] 15 (06/15 0446) BP: (106-158)/(54-95) 135/64 (06/15 0446) SpO2:  [89 %-100 %] 97 % (06/15 0446) Last BM Date: 08/31/19  Intake/Output from previous day: 06/14 0701 - 06/15 0700 In: 360 [P.O.:360] Out: -  Intake/Output this shift: No intake/output data recorded.  PE: Gen:  Alert, NAD, pleasant  HEENT: posterior scalp laceration with 2 staples in place, ecchymosis inferiorly, no active bleeding - scant SS drainage on pillow case.  Card:  Regular rate and rhythm, pedal pulses 2+ BL Pulm:  Normal effort ORA, holding left ribcage due to pain, clear to auscultation bilaterally with diminished breath sounds bilateral bases. Abd: Soft, non-tender, non-distended, bowel sounds present in all 4 quadrants, no HSM Skin: warm and dry, no rashes  Psych: A&Ox3   Lab Results:  Recent Labs    09/01/19 0502 09/02/19 0612  WBC 9.8 5.6  HGB 11.2* 10.0*  HCT 34.5* 31.0*  PLT 166 152   BMET Recent Labs    09/01/19 0502 09/02/19 0612  NA 141 142  K 3.9 3.7  CL 107 105  CO2 24 27  GLUCOSE 168* 107*  BUN 11 17  CREATININE 0.66 0.88  CALCIUM 9.2 9.1   PT/INR Recent Labs    09/01/19 0502  LABPROT 14.2  INR 1.1   CMP     Component Value Date/Time   NA 142 09/02/2019 0612   K 3.7 09/02/2019 0612   CL 105 09/02/2019 0612   CO2 27 09/02/2019 0612   GLUCOSE 107 (H) 09/02/2019 0612   BUN 17 09/02/2019 0612    CREATININE 0.88 09/02/2019 0612   CALCIUM 9.1 09/02/2019 0612   GFRNONAA 58 (L) 09/02/2019 0612   GFRAA >60 09/02/2019 0612   Lipase  No results found for: LIPASE     Studies/Results: DG CHEST PORT 1 VIEW  Result Date: 09/02/2019 CLINICAL DATA:  Rib fractures EXAM: PORTABLE CHEST 1 VIEW COMPARISON:  Yesterday FINDINGS: Improved aeration, although there is still atelectasis at the right base. Normal heart size and mediastinal contours. No visible effusion or pneumothorax. Known rib fractures. IMPRESSION: Atelectasis at the right base. Aeration is mildly improved from yesterday. Electronically Signed   By: Marnee Spring M.D.   On: 09/02/2019 06:33   DG Chest Port 1 View  Result Date: 09/01/2019 CLINICAL DATA:  Fall with rib fractures. EXAM: PORTABLE CHEST 1 VIEW COMPARISON:  None. FINDINGS: History of acute left rib fractures which are difficult to visualize radiographically. Low lung volumes with mild atelectasis at the bases. No visible effusion or pneumothorax. IMPRESSION: History of acute left rib fractures which are not well visualized. Low volume chest with mild atelectasis.  No visible pneumothorax. Electronically Signed   By: Marnee Spring M.D.   On: 09/01/2019 04:47   CT OUTSIDE FILMS BODY/ABD/PELVIS  Result Date: 09/01/2019 This examination belongs to an outside facility and is stored  here for comparison purposes only.  Contact the originating outside institution for any associated report or interpretation.   Anti-infectives: Anti-infectives (From admission, onward)   None     Assessment/Plan HLD HTN  Ground level fall L rib fractures - IS, pulm toilet, CXR this AM without PTX, there is some atelectasis  FEN - regular DVT - SCDs, LMWH Dispo - floor, HH PT/OT and DME ordered  possible PM discharge if mobilizes in the hallway with staff, tolerates PO, and is able to stay off of supplemental O2.     LOS: 0 days   Obie Dredge, [facility]  Surgery Please see Amion for pager number during day hours 7:00am-4:30pm

## 2019-09-02 NOTE — Care Management (Signed)
Patient/daughter had questions regarding Medicare Observation Notice:  Called in room, spoke with pt's daughter, Steward Drone.  Answered all questions to the best of my ability.  Daughter verbalizes understanding.    Quintella Baton, RN, BSN  Trauma/Neuro ICU Case Manager [phone]

## 2019-09-02 NOTE — Plan of Care (Signed)

## 2019-09-02 NOTE — Progress Notes (Signed)
PT Cancellation Note  Patient Details Name: Janet Dickerson MRN: [id number] DOB: Nov 17, 1927   Cancelled Treatment:    Reason Eval/Treat Not Completed: (P) Patient declined, no reason specified (Pt politely declined PT session to review stair training.  She and daughter present and reports she feels confident is getting into her home at d/c.)   Hollyann Pablo Artis Delay 09/02/2019, 3:36 PM Bonney Leitz , PTA Acute Rehabilitation Services Pager [phone] Office [phone]

## 2021-08-19 IMAGING — DX DG CHEST 1V PORT
1 series · 1 of 1 positions shown · non-contrast
Comparison: Yesterday

CLINICAL DATA: Rib fractures

EXAM:
PORTABLE CHEST 1 VIEW

[chest]
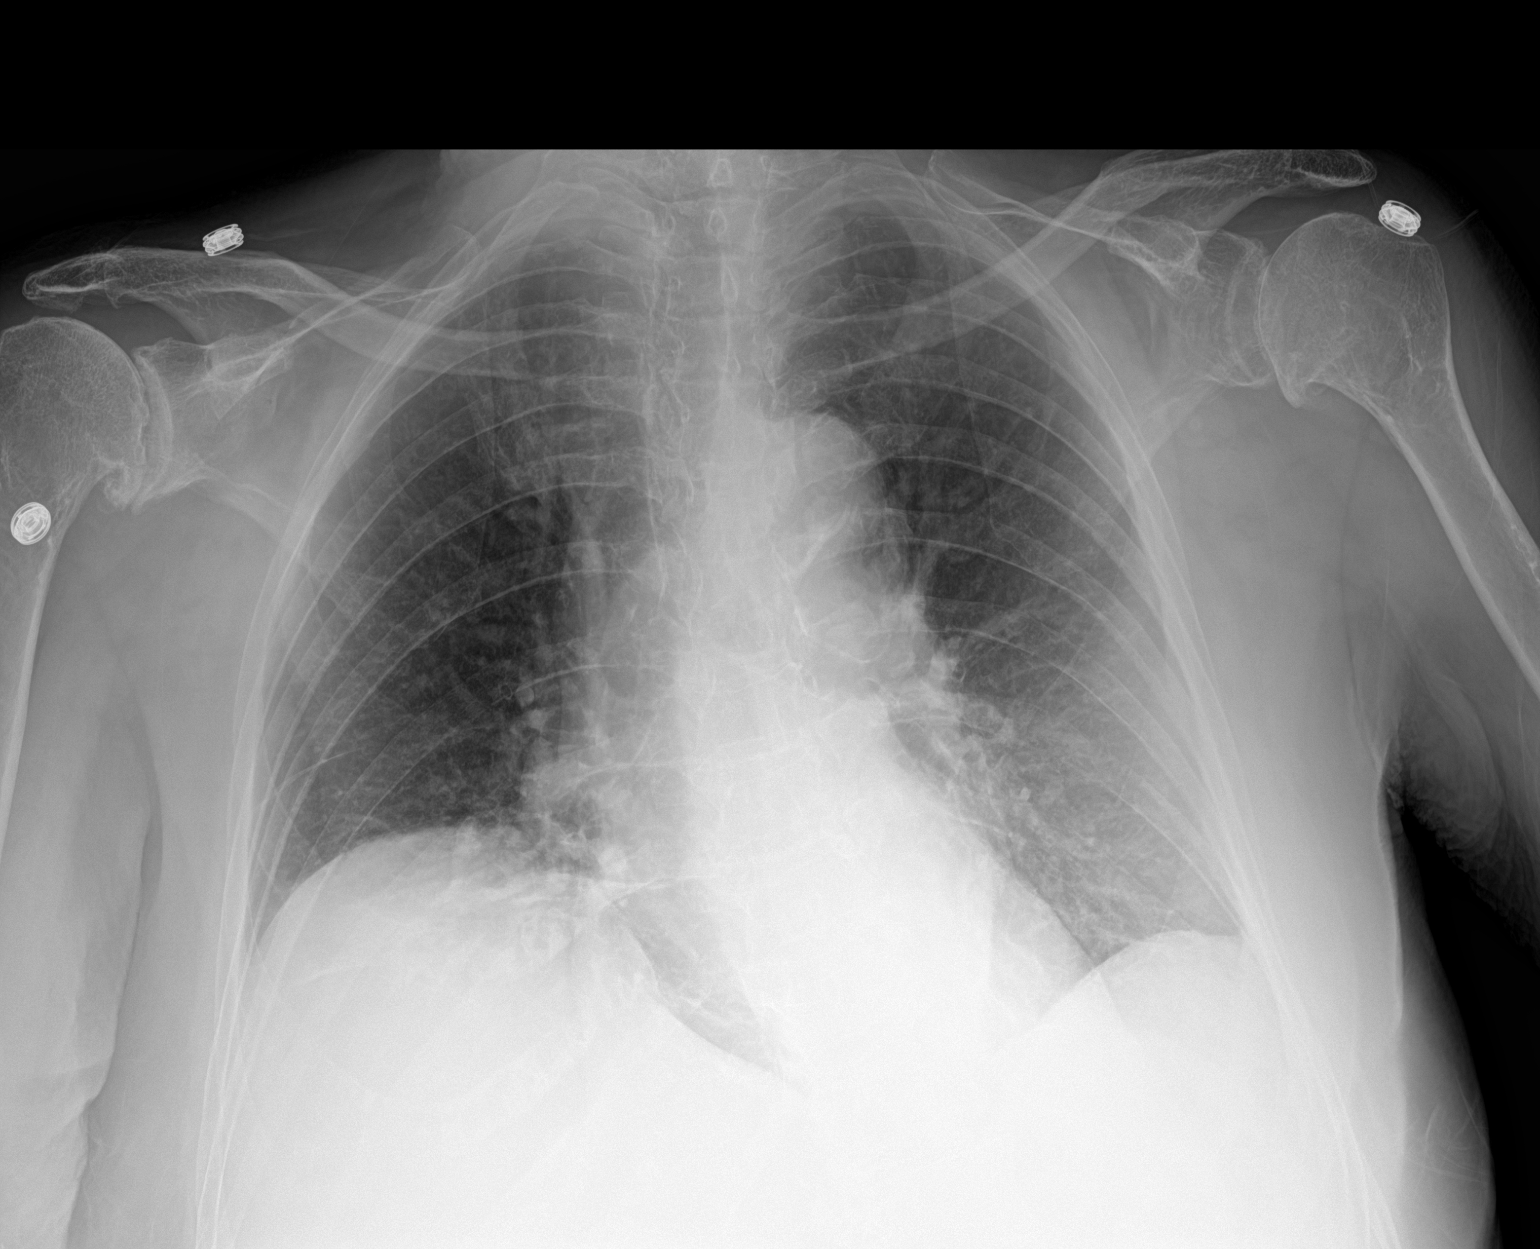

[1 of 1 positions shown; findings below may reference images not displayed]

FINDINGS: Improved aeration, although there is still atelectasis at the right
base. Normal heart size and mediastinal contours. No visible
effusion or pneumothorax. Known rib fractures.
IMPRESSION: Atelectasis at the right base. Aeration is mildly improved from
yesterday.
